# Patient Record
Sex: Male | Born: 1985 | Race: White | Hispanic: Yes | Marital: Single | Smoking: Never smoker
Health system: Southern US, Community
[De-identification: ages and names within clinical notes are randomized; demographics above are authoritative.]

---

## 2006-09-02 ENCOUNTER — Emergency Department (HOSPITAL_COMMUNITY): Admission: EM | Admit: 2006-09-02 | Discharge: 2006-09-03 | Payer: Self-pay | Admitting: Emergency Medicine

## 2008-09-10 IMAGING — CR DG SHOULDER 2+V*R*
2 series · 2 of 2 positions shown · non-contrast
Comparison: none

CLINICAL DATA: 21-year-old, fell. 
 RIGHT SHOULDER - 2 VIEW:

[w shoulder ap internal righ]
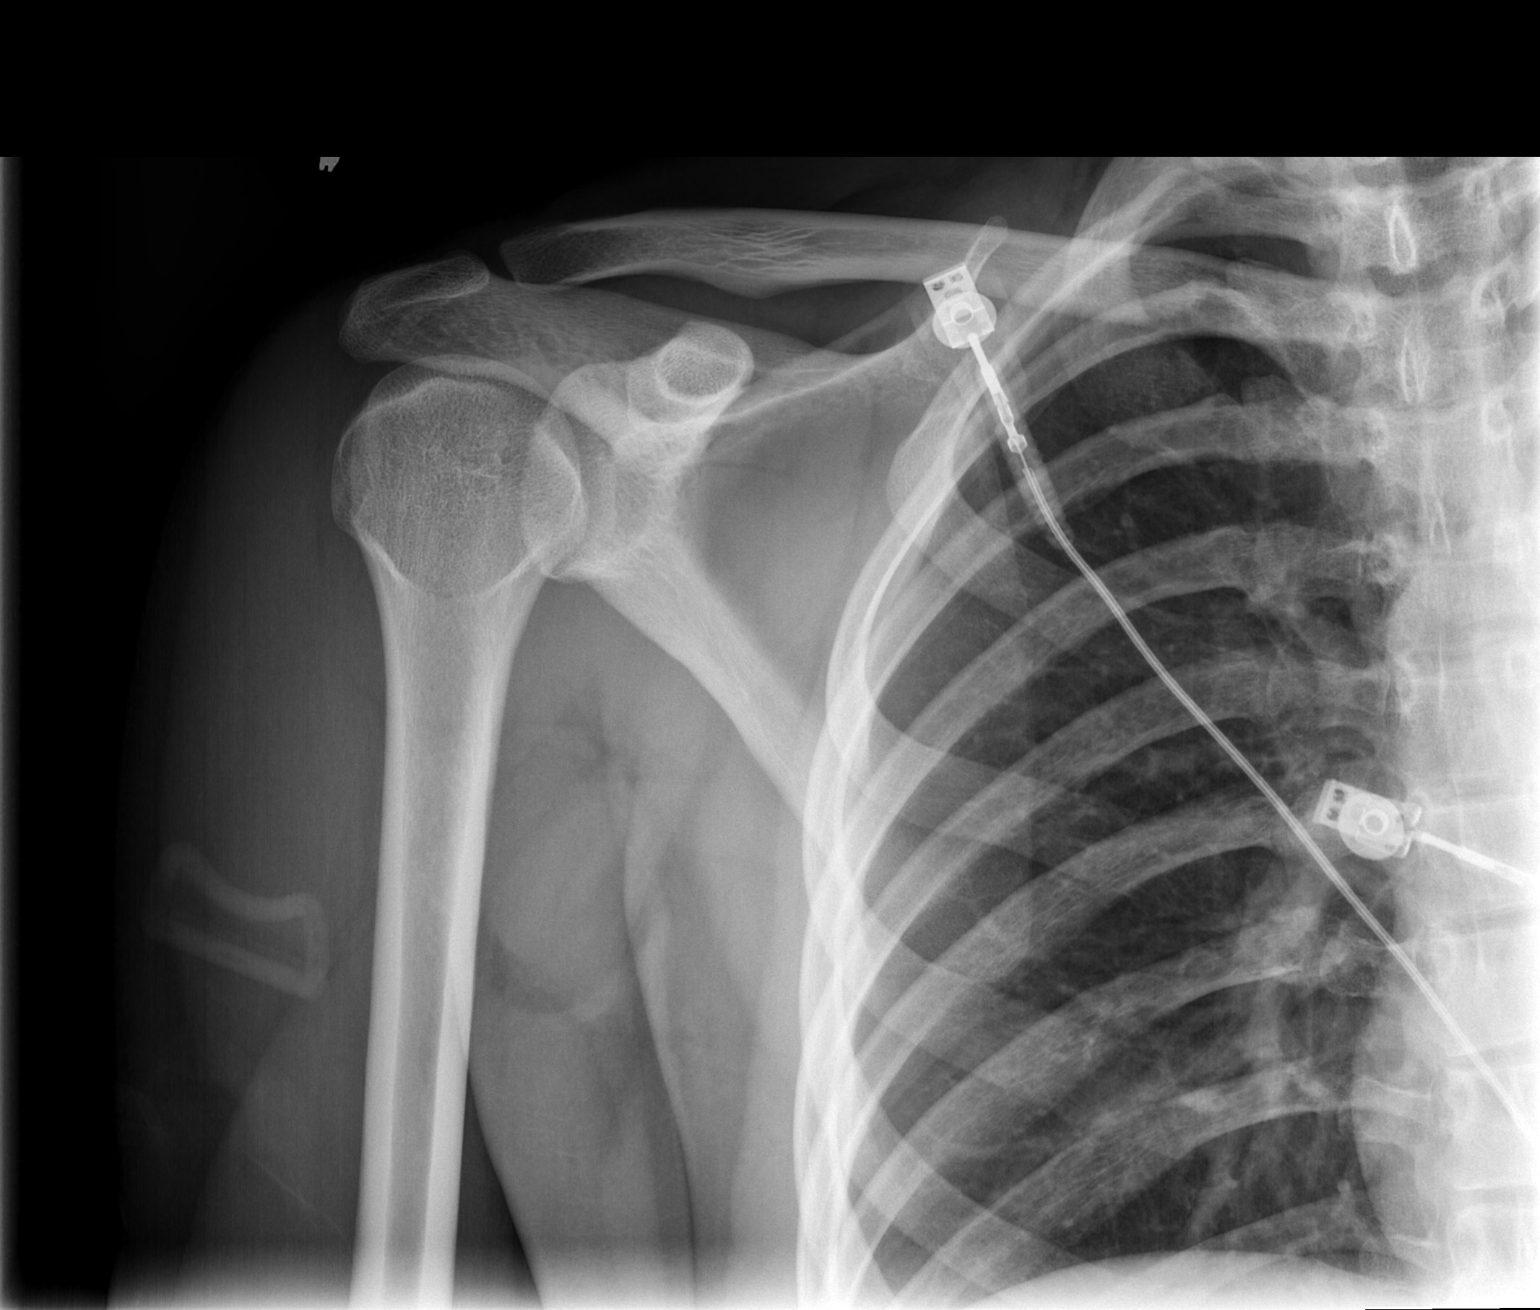

[w shoulder y view right]
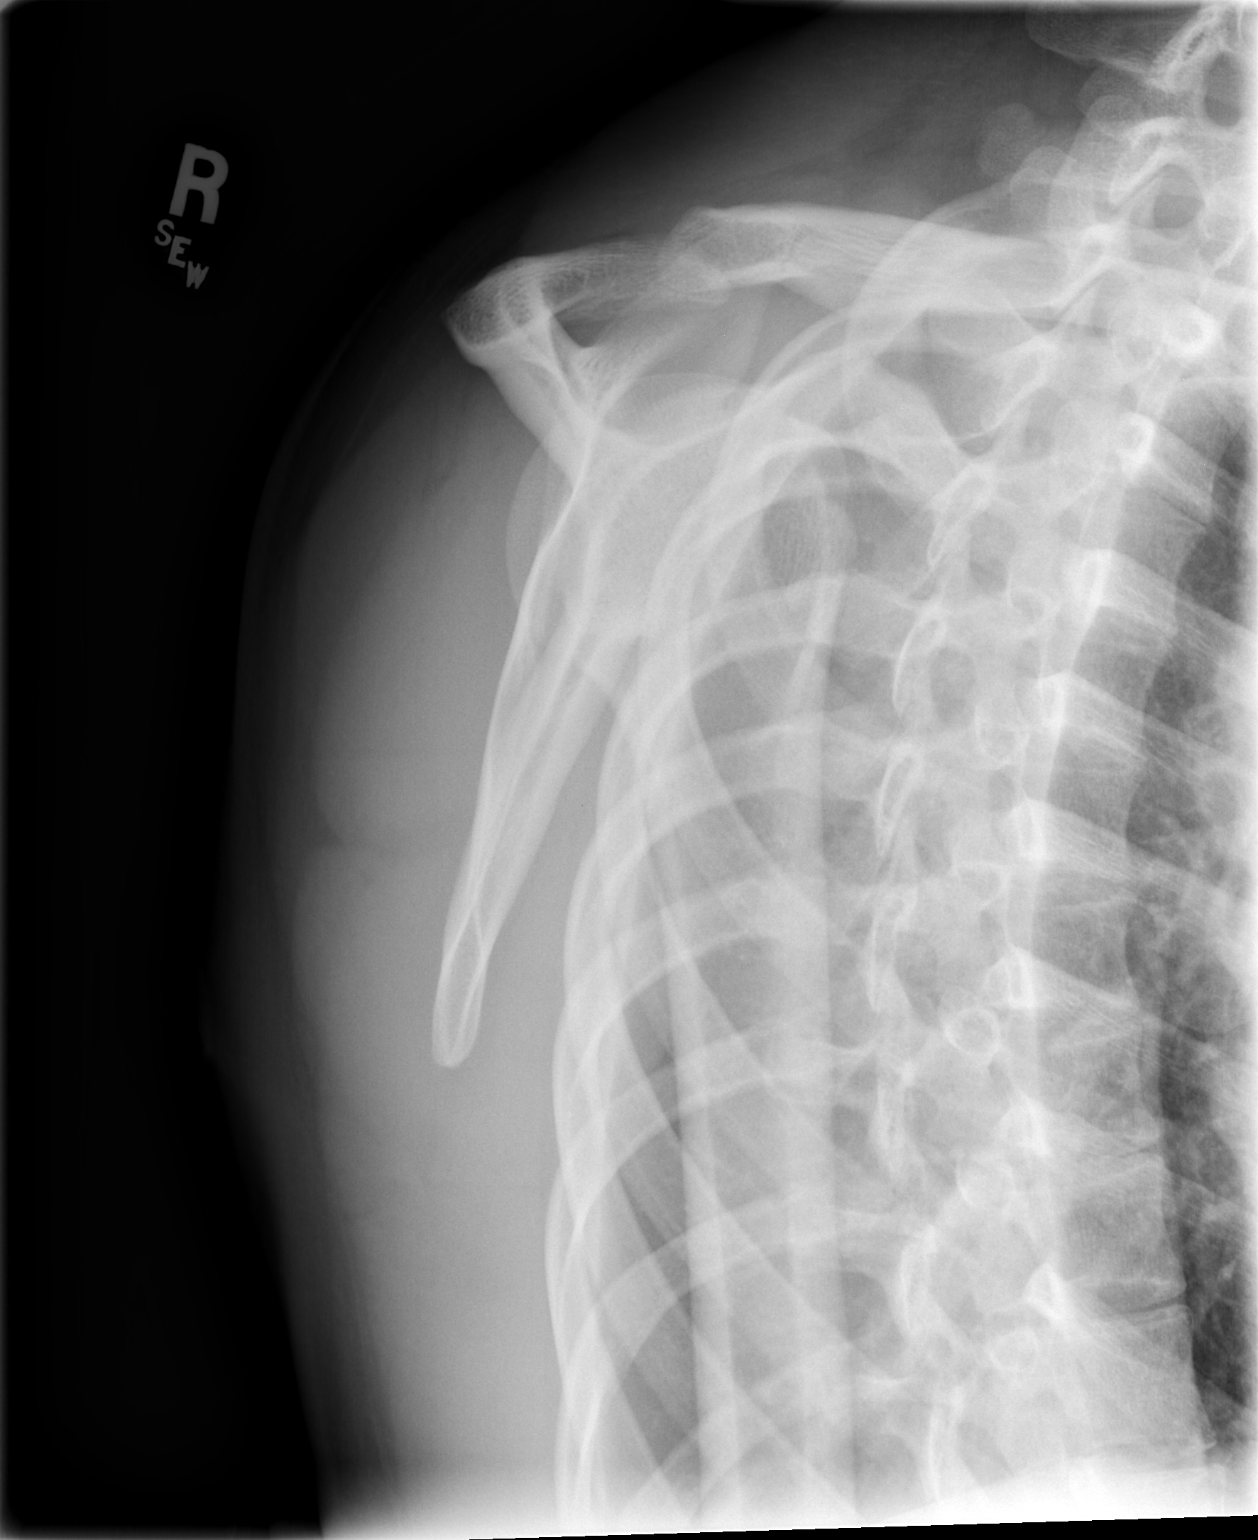

[2 of 2 positions shown; findings below may reference images not displayed]

FINDINGS: Interval reduction of the right humeral head dislocation.  No definite fractures are seen.
IMPRESSION: Reduction of right shoulder dislocation.  No definite fractures.

## 2013-11-15 ENCOUNTER — Emergency Department (HOSPITAL_COMMUNITY)
Admission: EM | Admit: 2013-11-15 | Discharge: 2013-11-16 | Disposition: A | Discharge status: Home / Self Care | Payer: Self-pay | Attending: Emergency Medicine | Admitting: Emergency Medicine

## 2013-11-15 ENCOUNTER — Encounter (HOSPITAL_COMMUNITY): Payer: Self-pay | Admitting: Emergency Medicine

## 2013-11-15 DIAGNOSIS — S99919A Unspecified injury of unspecified ankle, initial encounter: Principal | ICD-10-CM

## 2013-11-15 DIAGNOSIS — S99929A Unspecified injury of unspecified foot, initial encounter: Principal | ICD-10-CM

## 2013-11-15 DIAGNOSIS — S8990XA Unspecified injury of unspecified lower leg, initial encounter: Secondary | ICD-10-CM | POA: Insufficient documentation

## 2013-11-15 DIAGNOSIS — Y929 Unspecified place or not applicable: Secondary | ICD-10-CM | POA: Insufficient documentation

## 2013-11-15 DIAGNOSIS — Z792 Long term (current) use of antibiotics: Secondary | ICD-10-CM | POA: Insufficient documentation

## 2013-11-15 DIAGNOSIS — IMO0002 Reserved for concepts with insufficient information to code with codable children: Secondary | ICD-10-CM | POA: Insufficient documentation

## 2013-11-15 DIAGNOSIS — Y9389 Activity, other specified: Secondary | ICD-10-CM | POA: Insufficient documentation

## 2013-11-15 DIAGNOSIS — M79672 Pain in left foot: Secondary | ICD-10-CM

## 2013-11-15 MED ORDER — CEPHALEXIN 500 MG PO CAPS: 500.0000 mg | ORAL_CAPSULE | Freq: Four times a day (QID) | ORAL | Status: AC

## 2013-11-15 MED ORDER — HYDROCODONE-ACETAMINOPHEN 5-325 MG PO TABS
1.0000 | ORAL_TABLET | Freq: Once | ORAL | Status: AC
  Administered 2013-11-15: 1 via ORAL
  Filled 2013-11-15: qty 1

## 2013-11-15 MED ORDER — HYDROCODONE-ACETAMINOPHEN 5-325 MG PO TABS: 1.0000 | ORAL_TABLET | ORAL | Status: AC | PRN

## 2013-11-15 NOTE — ED Provider Notes (Addendum)
CSN: [id number]     Arrival date & time 11/15/13  1959 History   None    This chart was scribed for non-physician practitioner, Antony MaduraKelly Zeddie Njie, PA-C working with Raeford RazorStephen Kohut, MD by Arlan OrganAshley Leger, ED Scribe. This patient was seen in room WTR6/WTR6 and the patient's care was started at 10:53 PM.   Chief Complaint  Patient presents with  . Foot Injury   The history is provided by the patient. No language interpreter was used.    HPI Comments: Johnathan Marshall is a 28 y.o. male who presents to the Emergency Department complaining of a L foot injury that occurred 1 day ago. Pt states he stepped on a toothpick last night. States he feels some of the toothpick may still be embedded in his L inner foot. He reports pain to the area along with mild swelling since onset. He has tried pulling the remaining toothpick out with nail clippers without any success. He has not tried anything OTC for his discomfort. At this time he denies any  loss of sensation, numbness, or paresthesia. No pus or drainage. He has no other pertinent past medical history. No other concerns this visit.   History reviewed. No pertinent past medical history. History reviewed. No pertinent past surgical history. No family history on file. History  Substance Use Topics  . Smoking status: Never Smoker   . Smokeless tobacco: Never Used  . Alcohol Use: Yes    Review of Systems  Constitutional: Negative for fever and chills.  HENT: Negative for congestion.   Eyes: Negative for redness.  Respiratory: Negative for cough.   Skin: Positive for wound (L inner foot).  Neurological: Negative for numbness.  Psychiatric/Behavioral: Negative for confusion.     Allergies  Review of patient's allergies indicates no known allergies.  Home Medications   Prior to Admission medications   Medication Sig Start Date End Date Taking? Authorizing Provider  cephALEXin (KEFLEX) 500 MG capsule Take 1 capsule (500 mg total) by mouth 4 (four) times  daily. 11/15/13   Antony MaduraKelly Zorion Nims, PA-C  HYDROcodone-acetaminophen (NORCO/VICODIN) 5-325 MG per tablet Take 1 tablet by mouth every 4 (four) hours as needed for moderate pain or severe pain. 11/15/13   Antony MaduraKelly Shem Plemmons, PA-C   Triage Vitals: BP 103/76  Pulse 89  Temp(Src) 98.7 F (37.1 C) (Oral)  SpO2 96%   Physical Exam  Nursing note and vitals reviewed. Constitutional: He is oriented to person, place, and time. He appears well-developed and well-nourished. No distress.  Nontoxic/nonseptic appearing  HENT:  Head: Normocephalic and atraumatic.  Eyes: Conjunctivae and EOM are normal. No scleral icterus.  Neck: Normal range of motion.  Cardiovascular: Normal rate, regular rhythm and intact distal pulses.   Pulses:      Dorsalis pedis pulses are 2+ on the left side.       Posterior tibial pulses are 2+ on the left side.  Pulmonary/Chest: Effort normal. No respiratory distress.  Musculoskeletal: Normal range of motion.       Left ankle: Normal.       Left foot: He exhibits tenderness and swelling. He exhibits normal range of motion, no bony tenderness, normal capillary refill, no crepitus and no deformity.       Feet:  Swelling of distal L foot. TTP on plantar aspect of foot, just proximal to 1st MTP joint. Mild associated swelling. No weeping or drainage. No exposed FB. No red linear streaking.  Neurological: He is alert and oriented to person, place, and time. No  sensory deficit. GCS eye subscore is 4. GCS verbal subscore is 5. GCS motor subscore is 6.  Reflex Scores:      Patellar reflexes are 2+ on the left side.      Achilles reflexes are 2+ on the left side. Ambulatory with mild antalgic gait secondary to foot pain.   Skin: Skin is warm and dry. No rash noted. He is not diaphoretic. No erythema. No pallor.  Psychiatric: He has a normal mood and affect. His behavior is normal.    ED Course  FOREIGN BODY REMOVAL Date/Time: 11/17/2013 7:39 AM Performed by: Antony Madura Authorized by: Antony Madura Consent: Verbal consent obtained. written consent not obtained. The procedure was performed in an emergent situation. Risks and benefits: risks, benefits and alternatives were discussed Consent given by: patient Patient understanding: patient states understanding of the procedure being performed Patient consent: the patient's understanding of the procedure matches consent given Procedure consent: procedure consent matches procedure scheduled Relevant documents: relevant documents present and verified Test results: test results available and properly labeled Site marked: the operative site was marked Imaging studies: imaging studies available Required items: required blood products, implants, devices, and special equipment available Patient identity confirmed: verbally with patient and arm band Intake: plantar surface of L foot, proximal to great toe. Anesthesia: local infiltration Local anesthetic: lidocaine 2% with epinephrine Anesthetic total: 5 ml Patient sedated: no Patient restrained: no Patient cooperative: yes Complexity: simple 0 objects recovered. Post-procedure assessment: foreign body not removed Patient tolerance: Patient tolerated the procedure well with no immediate complications. Comments: Attempted foreign body removal of toothpick stuck in foot. Unclear whether FB visualized on ultrasound. Extensive wound exploration revealed no evidence of FB. No FB removed.   (including critical care time)  DIAGNOSTIC STUDIES: Oxygen Saturation is 96% on RA, adequate by my interpretation.    COORDINATION OF CARE: 10:56 PM-Discussed treatment plan with pt at bedside and pt agreed to plan.    Labs Review Labs Reviewed - No data to display  Imaging Review No results found.   EKG Interpretation None     EMERGENCY DEPARTMENT US SOFT TISSUE INTERPRETATION "Study: Limited Soft Tissue Ultrasound"  INDICATIONS: Pain Multiple views of the body part were obtained in  real-time with a multi-frequency linear probe PERFORMED BY:  Myself and Dr. Raeford Razor IMAGES ARCHIVED?: Yes SIDE:Left BODY PART: L foot FINDINGS: No distinct evidence of foreign body INTERPRETATION:  No abnormal findings noted  MDM   Final diagnoses:  Foot pain, left    Uncomplicated L foot pain, thought to be secondary to retained FB as patient states he stepped on a toothpick yesterday which broke off into his foot. Patient neurovascularly intact. No sensory deficits. Physical exam suggests early infection vs soft tissue irritation. No evidence of FB on extensive exploration. Will place on Keflex and have patient f/u with his primary care doctor. Norco given for pain control and return precautions provided. Patient agreeable to plan with no unaddressed concerns.   I personally performed the services described in this documentation, which was scribed in my presence. The recorded information has been reviewed and is accurate.   Filed Vitals:   11/15/13 2037 11/16/13 0003  BP: 103/76 144/89  Pulse: 89 82  Temp: 98.7 F (37.1 C)   TempSrc: Oral   SpO2: 96% 100%       Antony Madura, PA-C 11/17/13 0746  Antony Madura, PA-C 12/10/13 [phone]

## 2013-11-15 NOTE — ED Notes (Signed)
Pt states last night stepped on a toothpick and part of it broke off into L inner foot.

## 2013-11-15 NOTE — Discharge Instructions (Signed)
Keep area covered at all times. Take Keflex as prescribed. Take ibuprofen, 600 mg every 6 hours, as needed for pain control. You may take Norco for severe pain. Return if symptoms worsen.  Wound Care Wound care helps prevent pain and infection.  You may need a tetanus shot if:  You cannot remember when you had your last tetanus shot.  You have never had a tetanus shot.  The injury broke your skin. If you need a tetanus shot and you choose not to have one, you may get tetanus. Sickness from tetanus can be serious. HOME CARE   Only take medicine as told by your doctor.  Clean the wound daily with mild soap and water.  Change any bandages (dressings) as told by your doctor.  Put medicated cream and a bandage on the wound as told by your doctor.  Change the bandage if it gets wet, dirty, or starts to smell.  Take showers. Do not take baths, swim, or do anything that puts your wound under water.  Rest and raise (elevate) the wound until the pain and puffiness (swelling) are better.  Keep all doctor visits as told. GET HELP RIGHT AWAY IF:   Yellowish-white fluid (pus) comes from the wound.  Medicine does not lessen your pain.  There is a red streak going away from the wound.  You have a fever. MAKE SURE YOU:   Understand these instructions.  Will watch your condition.  Will get help right away if you are not doing well or get worse. Document Released: 03/12/2008 Document Revised: 08/26/2011 Document Reviewed: 10/07/2010 [facility] Patient Information 2014 [place], [place].

## 2013-11-18 NOTE — ED Provider Notes (Signed)
Medical screening examination/treatment/procedure(s) were conducted as a shared visit with non-physician practitioner(s) and myself.  I personally evaluated the patient during the encounter.   EKG Interpretation None     28 year old male with pain in his left foot after she stepped on a blue toothache. The tip of it was broken and is concerned that he has retained foreign body. I did not definitively see one on bedside ultrasound. The wound was explored. No foreign bodies seen or palpated. Not convinced that there is a retained foreign body, but this is certainly a possibility. Will place patient on antibiotics. Pain medication. Return precautions were discussed.  Raeford Razor, MD 11/18/13 [phone]

## 2013-12-12 NOTE — ED Provider Notes (Signed)
Medical screening examination/treatment/procedure(s) were conducted as a shared visit with non-physician practitioner(s) and myself.  I personally evaluated the patient during the encounter.   EKG Interpretation None     See other note.   Raeford RazorStephen Yamaira Spinner, MD 12/12/13 1104
